# Patient Record
Sex: Female | Born: 1980 | Race: White | Hispanic: No | Marital: Married | Smoking: Never smoker
Health system: Southern US, Community
[De-identification: ages and names within clinical notes are randomized; demographics above are authoritative.]

## PROBLEM LIST (undated history)

## (undated) DIAGNOSIS — T7840XA Allergy, unspecified, initial encounter: Secondary | ICD-10-CM

## (undated) DIAGNOSIS — R011 Cardiac murmur, unspecified: Secondary | ICD-10-CM

## (undated) DIAGNOSIS — R51 Headache: Secondary | ICD-10-CM

## (undated) DIAGNOSIS — R519 Headache, unspecified: Secondary | ICD-10-CM

## (undated) HISTORY — DX: Cardiac murmur, unspecified: R01.1

## (undated) HISTORY — PX: MANDIBLE SURGERY: SHX707

## (undated) HISTORY — DX: Allergy, unspecified, initial encounter: T78.40XA

## (undated) HISTORY — DX: Headache, unspecified: R51.9

## (undated) HISTORY — DX: Headache: R51

---

## 2014-01-03 ENCOUNTER — Encounter: Payer: Self-pay | Admitting: Family Medicine

## 2014-01-03 ENCOUNTER — Ambulatory Visit (INDEPENDENT_AMBULATORY_CARE_PROVIDER_SITE_OTHER): Payer: Federal, State, Local not specified - PPO | Admitting: Family Medicine

## 2014-01-03 VITALS — BP 104/60 | HR 77 | Temp 98.6°F | Ht 64.5 in | Wt 127.4 lb

## 2014-01-03 DIAGNOSIS — Z7689 Persons encountering health services in other specified circumstances: Secondary | ICD-10-CM

## 2014-01-03 DIAGNOSIS — Z8249 Family history of ischemic heart disease and other diseases of the circulatory system: Secondary | ICD-10-CM

## 2014-01-03 DIAGNOSIS — R002 Palpitations: Secondary | ICD-10-CM

## 2014-01-03 DIAGNOSIS — Z7189 Other specified counseling: Secondary | ICD-10-CM

## 2014-01-03 DIAGNOSIS — R011 Cardiac murmur, unspecified: Secondary | ICD-10-CM

## 2014-01-03 DIAGNOSIS — Z0000 Encounter for general adult medical examination without abnormal findings: Secondary | ICD-10-CM

## 2014-01-03 NOTE — Progress Notes (Signed)
No chief complaint on file.   HPI:  Mackenzie DeedsMegan Harrington is here to establish care. Moved from [place], [place].  Last PCP and physical: last physical about 3 years or more  Has the following chronic problems and concerns today:  There are no active problems to display for this patient.  FH HOCM: -mother is going to get surgery for hocm, rest of family saw cards for evaluation -she has hx of ? Murmur -occ palpitations/racing heart at rest -denies: cp, sob, swelling -wants referral to cardiologist as family has been getting on her to do this  Health Maintenance: -needs female physical -unsure of vaccine record but thinks up to date and will check and get back to us  ROS: See pertinent positives and negatives per HPI.  Past Medical History  Diagnosis Date  . Frequent headaches   . Heart murmur   . Allergy     Family History  Problem Relation Age of Onset  . Hypertrophic cardiomyopathy Mother   . Cancer Mother     breast cancer  . Hypertension Father     History   Social History  . Marital Status: Married    Spouse Name: N/A    Number of Children: N/A  . Years of Education: N/A   Social History Main Topics  . Smoking status: Never Smoker   . Smokeless tobacco: None  . Alcohol Use: No  . Drug Use: No  . Sexual Activity: Yes   Other Topics Concern  . None   Social History Narrative   Work or School: homemaker      Home Situation: lives with husband and 3 children 9, 6 and 4 yo (2015)      Spiritual Beliefs: none      Lifestyle: no regular exercise; diet fair             Current outpatient prescriptions:Cetirizine HCl (ZYRTEC PO), Take by mouth., Disp: , Rfl:   EXAM:  Filed Vitals:   01/03/14 1618  BP: 104/60  Pulse: 77  Temp: 98.6 F (37 C)    Body mass index is 21.54 kg/(m^2).  GENERAL: vitals reviewed and listed above, alert, oriented, appears well hydrated and in no acute distress  HEENT: atraumatic, conjunttiva clear, no obvious abnormalities  on inspection of external nose and ears  NECK: no obvious masses on inspection  LUNGS: clear to auscultation bilaterally, no wheezes, rales or rhonchi, good air movement  CV: HRRR, I/VI sem, no peripheral edema  MS: moves all extremities without noticeable abnormality  PSYCH: pleasant and cooperative, no obvious depression or anxiety  ASSESSMENT AND PLAN:  Discussed the following assessment and plan:  Family history of hypertrophic cardiomyopathy - Plan: Ambulatory referral to Cardiology  Palpitations - Plan: Ambulatory referral to Cardiology  Encounter to establish care - Plan: Lipid Panel, Hemoglobin A1c  Heart murmur - Plan: Ambulatory referral to Cardiology  Visit for preventive health examination - Plan: Lipid Panel, Hemoglobin A1c, Basic metabolic panel  -We reviewed the PMH, PSH, FH, SH, Meds and Allergies. -We provided refills for any medications we will prescribe as needed. -We addressed current concerns per orders and patient instructions. -We have asked for records for pertinent exams, studies, vaccines and notes from previous providers. -We have advised patient to follow up per instructions below. -basic labs today -preventive labs today -she will check vaccine record and lt us know if needs any -referred to cardiologist per her request -she wishes to see gyn for female physical and numbers provided to schedule -  she wishes to see derm for skin check and numbers provided to schedule -follow up yearly  -Patient advised to return or notify a doctor immediately if symptoms worsen or persist or new concerns arise.  Patient Instructions  -We have ordered labs or studies at this visit. It can take up to 1-2 weeks for results and processing. We will contact you with instructions IF your results are abnormal. Normal results will be released to your [facility]. If you have not heard from us or can not find your results in [facility] in 2 weeks please contact our  office.  -PLEASE SIGN UP FOR MYCHART TODAY   We recommend the following healthy lifestyle measures: - eat a healthy diet consisting of lots of vegetables, fruits, beans, nuts, seeds, healthy meats such as white chicken and fish and whole grains.  - avoid fried foods, fast food, processed foods, sodas, red meet and other fattening foods.  - get a least 150 minutes of aerobic exercise per week.   Follow up in: 1 year and as needed      Terressa KoyanagiHannah R. Bethany Cumming

## 2014-01-03 NOTE — Progress Notes (Signed)
Pre visit review using our clinic review tool, if applicable. No additional management support is needed unless otherwise documented below in the visit note. 

## 2014-01-03 NOTE — Patient Instructions (Signed)
-  We have ordered labs or studies at this visit. It can take up to 1-2 weeks for results and processing. We will contact you with instructions IF your results are abnormal. Normal results will be released to your MYCHART. If you have not heard from us or can not find your results in MYCHART in 2 weeks please contact our office.  -PLEASE SIGN UP FOR MYCHART TODAY   We recommend the following healthy lifestyle measures: - eat a healthy diet consisting of lots of vegetables, fruits, beans, nuts, seeds, healthy meats such as white chicken and fish and whole grains.  - avoid fried foods, fast food, processed foods, sodas, red meet and other fattening foods.  - get a least 150 minutes of aerobic exercise per week.   Follow up in: 1 year and as needed  

## 2014-01-04 LAB — BASIC METABOLIC PANEL
BUN: 11 mg/dL (ref 6–23)
CHLORIDE: 104 meq/L (ref 96–112)
CO2: 28 meq/L (ref 19–32)
Calcium: 9.4 mg/dL (ref 8.4–10.5)
Creatinine, Ser: 0.5 mg/dL (ref 0.4–1.2)
GFR: 136.58 mL/min (ref >60.00)
Glucose, Bld: 79 mg/dL (ref 70–99)
Potassium: 4.1 mEq/L (ref 3.5–5.1)
Sodium: 138 mEq/L (ref 135–145)

## 2014-01-04 LAB — LIPID PANEL
CHOLESTEROL: 159 mg/dL (ref 0–200)
HDL: 35.4 mg/dL — AB (ref >39.00)
LDL Cholesterol: 94 mg/dL (ref 0–99)
TRIGLYCERIDES: 150 mg/dL — AB (ref 0.0–149.0)
Total CHOL/HDL Ratio: 4
VLDL: 30 mg/dL (ref 0.0–40.0)

## 2014-01-04 LAB — HEMOGLOBIN A1C: HEMOGLOBIN A1C: 4.8 % (ref 4.6–6.5)

## 2014-01-05 ENCOUNTER — Encounter: Payer: Self-pay | Admitting: *Deleted

## 2014-01-23 ENCOUNTER — Encounter: Payer: Self-pay | Admitting: Internal Medicine

## 2014-01-23 ENCOUNTER — Ambulatory Visit (INDEPENDENT_AMBULATORY_CARE_PROVIDER_SITE_OTHER): Payer: Federal, State, Local not specified - PPO | Admitting: Internal Medicine

## 2014-01-23 ENCOUNTER — Encounter: Payer: Self-pay | Admitting: *Deleted

## 2014-01-23 VITALS — BP 110/62 | HR 70 | Ht 64.5 in | Wt 126.0 lb

## 2014-01-23 DIAGNOSIS — R002 Palpitations: Secondary | ICD-10-CM

## 2014-01-23 DIAGNOSIS — Z8249 Family history of ischemic heart disease and other diseases of the circulatory system: Secondary | ICD-10-CM

## 2014-01-23 NOTE — Progress Notes (Signed)
HPI patinet is a 33 yo who is referred for evaluation  Mother has history of HOCM Patient gives history of intermittent palpitations.  Rare.  Usually at rest.  Describes as a fluttering that lasts seconds.  No dizziness  Weird  Infrequent.   She had some dizziness with first pregnancy   None after.No syncope Patient is active with daily activities Denies CP  No SOB  No palpitaitons.    No Known Allergies  Current Outpatient Prescriptions  Medication Sig Dispense Refill  . Cetirizine HCl (ZYRTEC PO) Take by mouth.       No current facility-administered medications for this visit.    Past Medical History  Diagnosis Date  . Frequent headaches   . Heart murmur   . Allergy     Past Surgical History  Procedure Laterality Date  . Mandible surgery      Family History  Problem Relation Age of Onset  . Hypertrophic cardiomyopathy Mother   . Cancer Mother     breast cancer  . Hypertension Father     History   Social History  . Marital Status: Married    Spouse Name: N/A    Number of Children: N/A  . Years of Education: N/A   Occupational History  . Not on file.   Social History Main Topics  . Smoking status: Never Smoker   . Smokeless tobacco: Not on file  . Alcohol Use: No  . Drug Use: No  . Sexual Activity: Yes   Other Topics Concern  . Not on file   Social History Narrative   Work or School: homemaker      Home Situation: lives with husband and 3 children 359, 6 and 4 yo (2015)      Spiritual Beliefs: none      Lifestyle: no regular exercise; diet fair             Review of Systems:  All systems reviewed.  They are negative to the above problem except as previously stated.  Vital Signs: BP 110/62  Pulse 70  Ht 5' 4.5" (1.638 m)  Wt 126 lb (57.153 kg)  BMI 21.30 kg/m2  Physical Exam Patient is in NAD HEENT:  Normocephalic, atraumatic. EOMI, PERRLA.  Neck: JVP is normal.  No bruits.  Lungs: clear to auscultation. No rales no wheezes.  Heart:  Regular rate and rhythm. Normal S1, S2. No S3.   No significant murmurs. PMI not displaced.  Abdomen:  Supple, nontender. Normal bowel sounds. No masses. No hepatomegaly.  Extremities:   Good distal pulses throughout. No lower extremity edema.  Musculoskeletal :moving all extremities.  Neuro:   alert and oriented x3.  CN II-XII grossly intact.  EKG SR 70   Assessment and Plan:  1. HOCM  Will get echo to evaluate.    No murmur on exam  EKG normal I encouraged patient to find out more about mom  [place]  Surgery is planned she says for valve.    2.  Palpitations  Reassured patinet  I am not convinced on any problem  3.  Lipids  LDL is good at 94   HDL a little low  Increase exercise.  F/U based on echo results.

## 2014-01-23 NOTE — Patient Instructions (Signed)
Your physician has requested that you have an echocardiogram. Echocardiography is a painless test that uses sound waves to create images of your heart. It provides your doctor with information about the size and shape of your heart and how well your heart's chambers and valves are working. This procedure takes approximately one hour. There are no restrictions for this procedure.  Your physician recommends that you schedule a follow-up appointment as needed.  

## 2014-02-10 ENCOUNTER — Ambulatory Visit (HOSPITAL_COMMUNITY): Payer: Federal, State, Local not specified - PPO | Attending: Cardiology | Admitting: Cardiology

## 2014-02-10 DIAGNOSIS — I079 Rheumatic tricuspid valve disease, unspecified: Secondary | ICD-10-CM | POA: Insufficient documentation

## 2014-02-10 DIAGNOSIS — Z8249 Family history of ischemic heart disease and other diseases of the circulatory system: Secondary | ICD-10-CM

## 2014-02-10 DIAGNOSIS — R002 Palpitations: Secondary | ICD-10-CM | POA: Insufficient documentation

## 2014-02-10 NOTE — Progress Notes (Signed)
Echo performed. 

## 2014-02-15 ENCOUNTER — Telehealth: Payer: Self-pay | Admitting: *Deleted

## 2014-02-15 NOTE — Telephone Encounter (Signed)
Pt notified about echo results with no evidence of HOCM. Pt then asked if there was any signs of a murmur noted. I advised that Dr. Tenny Craw does not make note of any murmur. Pt asked for results to be mailed to her.

## 2014-02-15 NOTE — Telephone Encounter (Signed)
lmptcb for echo results 

## 2014-07-10 ENCOUNTER — Encounter: Payer: Self-pay | Admitting: Internal Medicine

## 2014-07-13 ENCOUNTER — Ambulatory Visit: Payer: Self-pay | Admitting: Internal Medicine

## 2014-08-15 ENCOUNTER — Encounter: Payer: Self-pay | Admitting: Internal Medicine

## 2014-08-15 ENCOUNTER — Ambulatory Visit (INDEPENDENT_AMBULATORY_CARE_PROVIDER_SITE_OTHER): Payer: Federal, State, Local not specified - PPO | Admitting: Internal Medicine

## 2014-08-15 ENCOUNTER — Other Ambulatory Visit (INDEPENDENT_AMBULATORY_CARE_PROVIDER_SITE_OTHER): Payer: Federal, State, Local not specified - PPO

## 2014-08-15 ENCOUNTER — Ambulatory Visit (INDEPENDENT_AMBULATORY_CARE_PROVIDER_SITE_OTHER): Payer: Federal, State, Local not specified - PPO | Admitting: Geriatric Medicine

## 2014-08-15 VITALS — BP 114/72 | HR 69 | Temp 97.3°F | Resp 16 | Ht 64.0 in | Wt 128.4 lb

## 2014-08-15 DIAGNOSIS — R5383 Other fatigue: Secondary | ICD-10-CM

## 2014-08-15 DIAGNOSIS — Z0000 Encounter for general adult medical examination without abnormal findings: Secondary | ICD-10-CM

## 2014-08-15 DIAGNOSIS — Z23 Encounter for immunization: Secondary | ICD-10-CM

## 2014-08-15 LAB — CBC
HEMATOCRIT: 41.7 % (ref 36.0–46.0)
Hemoglobin: 14.2 g/dL (ref 12.0–15.0)
MCHC: 34.1 g/dL (ref 30.0–36.0)
MCV: 86.1 fl (ref 78.0–100.0)
Platelets: 320 10*3/uL (ref 150.0–400.0)
RBC: 4.84 Mil/uL (ref 3.87–5.11)
RDW: 12.3 % (ref 11.5–15.5)
WBC: 6.3 10*3/uL (ref 4.0–10.5)

## 2014-08-15 LAB — TSH: TSH: 1.2 u[IU]/mL (ref 0.35–4.50)

## 2014-08-15 NOTE — Progress Notes (Signed)
Pre visit review using our clinic review tool, if applicable. No additional management support is needed unless otherwise documented below in the visit note. 

## 2014-08-15 NOTE — Patient Instructions (Signed)
We will check your blood work for the thyroid as well as your blood counts. We will call you back with those results.  We have given you a flu shot today. We will see back next year for your physical. If you have any new problems or questions please feel free to call our office.  We would recommend two groups for gynecology in town:  [facility]: [address] [place] [id number] Phone: [phone]  [facility]: [address], [address] [place] [address] P: [phone]  [facility]: [address],  [place], [place] [address] [phone]

## 2014-08-17 DIAGNOSIS — R5383 Other fatigue: Secondary | ICD-10-CM | POA: Insufficient documentation

## 2014-08-17 DIAGNOSIS — Z0000 Encounter for general adult medical examination without abnormal findings: Secondary | ICD-10-CM | POA: Insufficient documentation

## 2014-08-17 NOTE — Assessment & Plan Note (Signed)
Will check TSH as well as CBC. Possibly the fatigue is due to the weight gain as well as decreased exercise.

## 2014-08-17 NOTE — Assessment & Plan Note (Signed)
Reminded patient about diet and exercise. She will check on when her last tetanus was prior to next visit. She has had flu shot. She is up-to-date on Pap smear.

## 2014-08-17 NOTE — Progress Notes (Signed)
   Subjective:    Patient ID: Mackenzie Harrington, female    DOB: 08-Nov-1980, 33 y.o.   MRN: [id number]  HPI The patient is a 33 year old female who comes in today. She does have past medical history of seasonal allergies. She denies any new complaints today. She does take Zyrtec on most year-round for her allergies. She denies any chest pain, shortness of breath, abdominal pain, joint pain issues. She denies a balance problems or falls. She thinks she's had her tetanus shot within last 10 years and will check her records at home. She has been having a little bit of fatigue and weight gain however she's not clear if the fatigue came because of the weight gain or the weekend started first. She denies any rash, fever, chills.  Review of Systems  Constitutional: Negative for fever, activity change, appetite change, fatigue and unexpected weight change.  HENT: Negative.   Respiratory: Negative for cough, chest tightness, shortness of breath and wheezing.   Cardiovascular: Negative for chest pain, palpitations and leg swelling.  Gastrointestinal: Negative for nausea, abdominal pain, diarrhea, constipation and abdominal distention.  Musculoskeletal: Negative.   Skin: Negative.   Neurological: Negative.       Objective:   Physical Exam  Constitutional: She is oriented to person, place, and time. She appears well-developed and well-nourished.  HENT:  Head: Normocephalic and atraumatic.  Eyes: EOM are normal.  Neck: Normal range of motion.  Cardiovascular: Normal rate and regular rhythm.   Pulmonary/Chest: Effort normal and breath sounds normal. No respiratory distress. She has no wheezes. She has no rales.  Abdominal: Soft. Bowel sounds are normal. She exhibits no distension. There is no tenderness. There is no rebound.  Neurological: She is alert and oriented to person, place, and time. Coordination normal.  Skin: Skin is warm and dry.   Filed Vitals:   08/15/14 1522  BP: 114/72  Pulse: 69  Temp:  97.3 F (36.3 C)  TempSrc: Oral  Resp: 16  Height: 5\' 4"  (1.626 m)  Weight: 128 lb 6.4 oz (58.242 kg)  SpO2: 97%      Assessment & Plan:

## 2015-01-26 ENCOUNTER — Other Ambulatory Visit: Payer: Self-pay | Admitting: Obstetrics and Gynecology

## 2015-01-26 DIAGNOSIS — Z803 Family history of malignant neoplasm of breast: Secondary | ICD-10-CM

## 2015-02-07 ENCOUNTER — Ambulatory Visit
Admission: RE | Admit: 2015-02-07 | Discharge: 2015-02-07 | Disposition: A | Discharge status: Home / Self Care | Payer: Federal, State, Local not specified - PPO | Source: Ambulatory Visit | Attending: Obstetrics and Gynecology | Admitting: Obstetrics and Gynecology

## 2015-02-07 DIAGNOSIS — Z803 Family history of malignant neoplasm of breast: Secondary | ICD-10-CM

## 2015-02-07 MED ORDER — GADOBENATE DIMEGLUMINE 529 MG/ML IV SOLN
12.0000 mL | Freq: Once | INTRAVENOUS | Status: AC | PRN
  Administered 2015-02-07: 12 mL via INTRAVENOUS

## 2015-08-24 ENCOUNTER — Ambulatory Visit (INDEPENDENT_AMBULATORY_CARE_PROVIDER_SITE_OTHER): Payer: Federal, State, Local not specified - PPO

## 2015-08-24 DIAGNOSIS — Z23 Encounter for immunization: Secondary | ICD-10-CM | POA: Diagnosis not present

## 2016-01-21 ENCOUNTER — Ambulatory Visit: Payer: Federal, State, Local not specified - PPO | Admitting: Family

## 2016-03-18 ENCOUNTER — Other Ambulatory Visit: Payer: Self-pay | Admitting: Obstetrics and Gynecology

## 2016-03-18 DIAGNOSIS — Z803 Family history of malignant neoplasm of breast: Secondary | ICD-10-CM

## 2016-04-07 ENCOUNTER — Ambulatory Visit
Admission: RE | Admit: 2016-04-07 | Discharge: 2016-04-07 | Disposition: A | Discharge status: Home / Self Care | Payer: Federal, State, Local not specified - PPO | Source: Ambulatory Visit | Attending: Obstetrics and Gynecology | Admitting: Obstetrics and Gynecology

## 2016-04-07 DIAGNOSIS — Z803 Family history of malignant neoplasm of breast: Secondary | ICD-10-CM

## 2016-04-07 MED ORDER — GADOBENATE DIMEGLUMINE 529 MG/ML IV SOLN
16.0000 mL | Freq: Once | INTRAVENOUS | Status: AC | PRN
  Administered 2016-04-07: 16 mL via INTRAVENOUS

## 2016-04-10 ENCOUNTER — Other Ambulatory Visit: Payer: Self-pay | Admitting: Obstetrics and Gynecology

## 2016-04-10 DIAGNOSIS — N63 Unspecified lump in unspecified breast: Secondary | ICD-10-CM

## 2016-04-11 ENCOUNTER — Ambulatory Visit
Admission: RE | Admit: 2016-04-11 | Discharge: 2016-04-11 | Disposition: A | Discharge status: Home / Self Care | Payer: Federal, State, Local not specified - PPO | Source: Ambulatory Visit | Attending: Obstetrics and Gynecology | Admitting: Obstetrics and Gynecology

## 2016-04-11 ENCOUNTER — Ambulatory Visit: Payer: Federal, State, Local not specified - PPO

## 2016-04-11 ENCOUNTER — Other Ambulatory Visit: Payer: Self-pay | Admitting: Obstetrics and Gynecology

## 2016-04-11 DIAGNOSIS — N63 Unspecified lump in unspecified breast: Secondary | ICD-10-CM

## 2016-04-11 MED ORDER — GADOBENATE DIMEGLUMINE 529 MG/ML IV SOLN
11.0000 mL | Freq: Once | INTRAVENOUS | Status: AC | PRN
  Administered 2016-04-11: 11 mL via INTRAVENOUS

## 2016-11-10 ENCOUNTER — Other Ambulatory Visit: Payer: Self-pay | Admitting: Obstetrics and Gynecology

## 2016-11-10 DIAGNOSIS — N63 Unspecified lump in unspecified breast: Secondary | ICD-10-CM

## 2016-11-20 ENCOUNTER — Ambulatory Visit
Admission: RE | Admit: 2016-11-20 | Discharge: 2016-11-20 | Disposition: A | Discharge status: Home / Self Care | Payer: Federal, State, Local not specified - PPO | Source: Ambulatory Visit | Attending: Obstetrics and Gynecology | Admitting: Obstetrics and Gynecology

## 2016-11-20 DIAGNOSIS — N63 Unspecified lump in unspecified breast: Secondary | ICD-10-CM

## 2016-11-20 MED ORDER — GADOBENATE DIMEGLUMINE 529 MG/ML IV SOLN
12.0000 mL | Freq: Once | INTRAVENOUS | Status: AC | PRN
  Administered 2016-11-20: 12 mL via INTRAVENOUS

## 2017-04-30 ENCOUNTER — Encounter: Payer: Federal, State, Local not specified - PPO | Admitting: Genetics

## 2017-04-30 ENCOUNTER — Other Ambulatory Visit: Payer: Federal, State, Local not specified - PPO

## 2017-05-05 ENCOUNTER — Ambulatory Visit (HOSPITAL_BASED_OUTPATIENT_CLINIC_OR_DEPARTMENT_OTHER): Payer: Federal, State, Local not specified - PPO | Admitting: Genetics

## 2017-05-05 ENCOUNTER — Encounter: Payer: Self-pay | Admitting: Genetics

## 2017-05-05 ENCOUNTER — Other Ambulatory Visit: Payer: Federal, State, Local not specified - PPO

## 2017-05-05 DIAGNOSIS — Z7183 Encounter for nonprocreative genetic counseling: Secondary | ICD-10-CM

## 2017-05-05 DIAGNOSIS — Z803 Family history of malignant neoplasm of breast: Secondary | ICD-10-CM | POA: Diagnosis not present

## 2017-05-05 NOTE — Progress Notes (Signed)
REFERRING PROVIDER: Marylynn Pearson, [place], [address] [place], [place] [address]  PRIMARY PROVIDER:  Aura Dials, PA-C  PRIMARY REASON FOR VISIT:  1. Family history of breast cancer     HISTORY OF PRESENT ILLNESS:   Ms. Steidle, a 36 y.o. female, was seen for a [place] cancer genetics consultation at the request of Dr. Julien Girt due to a family history of cancer.  Ms. Kloos presents to clinic today to discuss the possibility of a hereditary predisposition to cancer, genetic testing, and to further clarify her future cancer risks, as well as potential cancer risks for family members.     CANCER HISTORY: Ms. Lewison is a 36 y.o. female with no personal history of cancer.    HORMONAL RISK FACTORS:  Menarche was at age 20.  First live birth at age 80.  OCP use for approximately 12 years, used between ages [age] though stopped intermittently for pregnancies. Ovaries intact: yes.  Hysterectomy: no.  Menopausal status: premenopausal.  HRT use: 0 years. Colonoscopy: no; not examined. Mammogram within the last year: yes. Number of breast biopsies: 0. Up to date with pelvic exams:  yes. Any excessive radiation exposure in the past:  no  Past Medical History:  Diagnosis Date  . Allergy   . Frequent headaches   . Heart murmur     Past Surgical History:  Procedure Laterality Date  . MANDIBLE SURGERY      Social History   Social History  . Marital status: Married    Spouse name: N/A  . Number of children: N/A  . Years of education: N/A   Social History Main Topics  . Smoking status: Never Smoker  . Smokeless tobacco: Not on file  . Alcohol use No  . Drug use: No  . Sexual activity: Yes   Other Topics Concern  . Not on file   Social History Narrative   Work or School: homemaker      Home Situation: lives with husband and 3 children 2, 20 and 98 yo (2015)      Spiritual Beliefs: none      Lifestyle: no regular exercise; diet fair               FAMILY HISTORY:  We obtained a detailed, 4-generation family history.  Significant diagnoses are listed below: Family History  Problem Relation Age of Onset  . Hypertrophic cardiomyopathy Mother   . Breast cancer Mother 38       treated with mastectomy  . Hypertension Father   Ms. Cartelli has two sons and a daughter (ages 73, 38, and 31) who are all healthy. Ms. Mudry has three sisters (ages 8, 94, and 53) and one brother (age 60) who are all without cancers.  Ms. Plitt mother was diagnosed with breast cancer at age 68, was treated with unilateral mastectomy, and is doing well at age 65 without additional cancers. Ms. Eshleman had two maternal aunts. One died "young" and another died at 34, both due to accidents. Ms. Botelho maternal uncle is 23 without cancers. Ms. Oddo maternal grandfather died at 8 from a heart attack. Her maternal grandmother died at 82 without cancers.  Ms. Oconnor father is 21 without cancers. She has two paternal uncles (ages 67 and 100) who are also without cancers. Both of Ms. Cohea's paternal grandparents died in their 26s without cancers.  Ms. Forgy is unaware of previous family history of genetic testing for hereditary cancer risks. Patient's maternal and paternal ancestors  are of Caucasian descent. There is no reported Ashkenazi Jewish ancestry. There is no known consanguinity.  GENETIC COUNSELING ASSESSMENT: Akeelah Seppala is a 36 y.o. female with a family history of breast cancer diagnosed at age 53 in her mother, which is somewhat suggestive of a hereditary cancer syndrome and predisposition to cancer. We, therefore, discussed and recommended the following at today's visit.   DISCUSSION: We reviewed the characteristics, features and inheritance patterns of hereditary cancer syndromes. We also discussed genetic testing, including the appropriate family members to test, the process of testing, insurance coverage and turn-around-time for results. We discussed the  implications of a negative, positive and/or variant of uncertain significant result. We recommended Ms. Rider pursue genetic testing for the Common Hereditary Cancer Panel offered by [facility]. [facility]'s Common Hereditary Cancers Panel includes analysis of the following 46 genes: APC, ATM, AXIN2, BARD1, BMPR1A, BRCA1, BRCA2, BRIP1, CDH1, CDKN2A, CHEK2, CTNNA1, DICER1, EPCAM, GREM1, HOXB13, KIT, MEN1, MLH1, MSH2, MSH3, MSH6, MUTYH, NBN, NF1, NTHL1, PALB2, PDGFRA, PMS2, POLD1, POLE, PTEN, RAD50, RAD51C, RAD51D, SDHA, SDHB, SDHC, SDHD, SMAD4, SMARCA4, STK11, TP53, TSC1, TSC2, and VHL.  Based on Ms. Puleo's family history of cancer, she meets medical criteria for genetic testing. Despite that she meets criteria, she may still have an out of pocket cost. We discussed that if her out of pocket cost for testing is over $100, the laboratory will call and confirm whether she wants to proceed with testing.  If the out of pocket cost of testing is less than $100 she will be billed by the genetic testing laboratory.   PLAN: After considering the risks, benefits, and limitations, Ms. Geter  provided informed consent to pursue genetic testing and the blood sample was sent to [facility] for analysis of the 46-gene Common Hereditary Cancer Panel. Results should be available within approximately 3 weeks' time, at which point they will be disclosed by telephone to Ms. Mcgough, as will any additional recommendations warranted by these results. This information will also be available in Epic.   Lastly, we encouraged Ms. Karlen to remain in contact with cancer genetics annually so that we can continuously update the family history and inform her of any changes in cancer genetics and testing that may be of benefit for this family.   Ms.  Branscomb questions were answered to her satisfaction today. Our contact information was provided should additional questions or concerns arise. Thank you for the referral and allowing [place] to  share in the care of your patient.   Mal Misty, MS, [facility] Certified Naval [email or web address] phone: [phone]  The patient was seen for a total of 40 minutes in face-to-face genetic counseling.   _______________________________________________________________________ For Office Staff:  Number of people involved in session: 1 Was an Intern/ student involved with case: yes   [place] (IBIS) Breast Cancer Risk Model Summary

## 2017-05-19 ENCOUNTER — Telehealth: Payer: Self-pay | Admitting: Genetics

## 2017-05-20 NOTE — Telephone Encounter (Signed)
Patient called back to discuss genetic test results.  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  I discussed that while reassuring, she should still continue to follow breast screening recommendations by her doctors.  Discussed that genetic testing is not perfect and there could still be a genetic mutation in a gene that was not detected by current technology or in a gene that was not tested or not yet identified to increase cancer risk.  Will mail results and provided my contact information for any further questions.

## 2017-05-25 ENCOUNTER — Ambulatory Visit: Payer: Self-pay | Admitting: Genetics

## 2017-05-25 ENCOUNTER — Encounter: Payer: Self-pay | Admitting: Genetics

## 2017-05-25 DIAGNOSIS — Z1379 Encounter for other screening for genetic and chromosomal anomalies: Secondary | ICD-10-CM

## 2017-05-25 DIAGNOSIS — Z803 Family history of malignant neoplasm of breast: Secondary | ICD-10-CM

## 2017-05-25 NOTE — Progress Notes (Signed)
HPI: Mackenzie [facility]rington was previously seen in the [facility] on 05/05/2017 due to a family history of breast cancer and concerns regarding a hereditary predisposition to cancer. Please refer to our prior cancer genetics clinic note for more information regarding Mackenzie Harrington's medical, social and family histories, and our assessment and recommendations, at the time. [facility] Harrington recent genetic test results were disclosed to her, as well as recommendations warranted by these results. These results and recommendations are discussed in more detail below.   FAMILY HISTORY:  We obtained a detailed, 4-generation family history.  Significant diagnoses are listed below: Family History  Problem Relation Age of Onset  . Hypertrophic cardiomyopathy Mother   . Breast cancer Mother 39       treated with mastectomy  . Hypertension Father    Mackenzie Harrington has two sons and a daughter (ages 54, 63, and 87) who are all healthy. Mackenzie Harrington has three sisters (ages 41, 59, and 108) and one brother (age 68) who are all without cancers.  Mackenzie Harrington mother was diagnosed with breast cancer at age 57, was treated with unilateral mastectomy, and is doing well at age 72 without additional cancers. Mackenzie Harrington had two maternal aunts. One died "young" and another died at 59, both due to accidents. Mackenzie Harrington maternal uncle is 50 without cancers. Mackenzie Harrington maternal grandfather died at 80 from a heart attack. Her maternal grandmother died at 27 without cancers.  Mackenzie Harrington father is 45 without cancers. She has two paternal uncles (ages 62 and 50) who are also without cancers. Both of Mackenzie Harrington's paternal grandparents died in their 65s without cancers.  Mackenzie Harrington is unaware of previous family history of genetic testing for hereditary cancer risks. Patient's maternal and paternal ancestors are of Caucasian descent. There is no reported Ashkenazi Jewish ancestry. There is no known consanguinity.  GENETIC TEST RESULTS:  Genetic testing performed through [facility]'s Common Hereditary Cancer Panel reported out on 05/15/2017 showed no pathogenic mutations. The Hereditary Gene Panel offered by [facility] includes sequencing and/or deletion duplication testing of the following 46 genes: APC, ATM, AXIN2, BARD1, BMPR1A, BRCA1, BRCA2, BRIP1, CDH1, CDKN2A (p14ARF), CDKN2A (p16INK4a), CHEK2, CTNNA1, DICER1, EPCAM (Deletion/duplication testing only), GREM1 (promoter region deletion/duplication testing only), KIT, MEN1, MLH1, MSH2, MSH3, MSH6, MUTYH, NBN, NF1, NHTL1, PALB2, PDGFRA, PMS2, POLD1, POLE, PTEN, RAD50, RAD51C, RAD51D, SDHB, SDHC, SDHD, SMAD4, SMARCA4. STK11, TP53, TSC1, TSC2, and VHL.  The following genes were evaluated for sequence changes only: SDHA and HOXB13 c.251G>A variant only..  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   The test report will be scanned into [facility] and will be located under the Molecular Pathology section of the Results Review tab.A portion of the result report is included below for reference.      We discussed with Mackenzie Harrington that because current genetic testing is not perfect, it is possible there may be a gene mutation in one of these genes that current testing cannot detect, but that chance is small. We also discussed, that there could be another gene that has not yet been discovered, or that we have not yet tested, that is responsible for the cancer diagnoses in the family. It is also possible that there is a hereditary cause for Mackenzie Harrington's family history of cancer that she did not inherit and therefore was not detected in her.  Therefore, it is important to remain in touch with cancer genetics in  the future so that we can continue to offer Mackenzie Harrington the most up to date genetic testing.   Regarding the VUS in PALB2: At this time, it is unknown if this variant is associated with increased cancer risk or if this is a normal finding, but  most variants such as this get reclassified to being inconsequential. It should not be used to make medical management decisions. With time, we suspect the lab will determine the significance of this variant, if any. If we do learn more about it, we will try to contact [facility] to discuss it further. However, it is important to stay in touch with [place] periodically and keep the address and phone number up to date.  ADDITIONAL GENETIC TESTING: We discussed with [facility]enzie Harrington that there are other genes that are associated with increased cancer risk that can be analyzed. The laboratories that offer this testing look at these additional genes via a hereditary cancer gene panel. Should Mackenzie Harrington wish to pursue additional genetic testing, we are happy to discuss and coordinate this testing, at any time.    CANCER SCREENING RECOMMENDATIONS: This normal result indicates that it is unlikely Mackenzie Harrington has an increased risk of cancer due to a mutation in one of these genes.  Therefore, Mackenzie Harrington was advised to continue following the cancer screening guidelines provided by her primary healthcare providers. Other factors such as her personal and family history may still affect her cancer risk.    Based on the Mackenzie Harrington's personal and family history of cancer, as well as her genetic test results, the statistical model [facility] was used to estimate her risk of developing breast cancer. This estimates her lifetime risk of developing breast caner to be approximately 18.6% The patient's lifetime breast cancer risk is a preliminary estimate based on available information using one of several models endorsed by the [facility] (ACS). The ACS recommends consideration of breast MRI screening as an adjunct to mammography for patients at high risk (defined as 20% or greater lifetime risk). A more detailed breast cancer risk assessment can be considered, if clinically indicated.     RECOMMENDATIONS FOR FAMILY MEMBERS:  Women in this family might be at some increased risk of developing cancer, over the general population risk, simply due to the family history of cancer. We recommended women in this family have a yearly mammogram beginning at age 31, or 58 years younger than the earliest onset of cancer, an annual clinical breast exam, and perform monthly breast self-exams.  We would recommend that Ms. Shelburne and her sisters start mammograms at 76 (or now if she has had not yet started).  Women in this family should also have a gynecological exam as recommended by their primary provider. All family members should have a colonoscopy by age 48.  Based on Ms. Catano's family history, we recommended her mother, who was diagnosed with breast at age 60, have genetic counseling and testing.  If her mother declines, we would recommend that Ms. Plotts's siblings and maternal relatives have genetic counseling and genetic testing.  Ms. Ziehm will let us know if we can be of any assistance in coordinating genetic counseling and/or testing for these family members.   FOLLOW-UP: Lastly, we discussed with Ms. Butz that cancer genetics is a rapidly advancing field and it is possible that new genetic tests will be appropriate for her and/or her family members in the future. We encouraged her to remain in contact with cancer genetics on  an annual basis so we can update her personal and family histories and let her know of advances in cancer genetics that may benefit this family.   Our contact number was provided. Ms. Celaya questions were answered to her satisfaction, and she knows she is welcome to call us at anytime with additional questions or concerns.   Ferol Luz, MS Genetic Counselor [email or web address]

## 2017-10-26 ENCOUNTER — Telehealth: Payer: Self-pay | Admitting: Genetics

## 2017-10-26 NOTE — Telephone Encounter (Signed)
Returned patient's call to our Environmental health practitioneradministrative assistant regarding billing.  I gave her my contact information, but directed her to calling the laboratory as they will have the most information about her insurance claim.  I left their phone number for her.

## 2018-03-16 ENCOUNTER — Telehealth: Payer: Self-pay | Admitting: Genetic Counselor

## 2018-03-16 NOTE — Telephone Encounter (Signed)
LM on VM that we have an update on her results from last year and to please give me a call.

## 2018-03-22 NOTE — Telephone Encounter (Signed)
LM on VM that we have updated information on her testing.  Please call back.  Left CB instructions.

## 2018-03-23 NOTE — Telephone Encounter (Signed)
Left message on VM asking for her to CB about amended report.  I will reach out to Dr. Garlan FillersAdkin's office to confirm that we have the correct phone number.

## 2018-03-24 ENCOUNTER — Telehealth: Payer: Self-pay | Admitting: Genetic Counselor

## 2018-03-24 NOTE — Telephone Encounter (Signed)
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  Patient will be out of town until next week and will come in for [facility] on 7/29.

## 2018-04-05 ENCOUNTER — Inpatient Hospital Stay: Payer: Federal, State, Local not specified - PPO

## 2018-04-05 ENCOUNTER — Encounter: Payer: Self-pay | Admitting: Genetic Counselor

## 2018-05-05 ENCOUNTER — Inpatient Hospital Stay: Payer: Federal, State, Local not specified - PPO | Attending: Genetic Counselor | Admitting: Genetic Counselor

## 2018-05-05 DIAGNOSIS — Z7183 Encounter for nonprocreative genetic counseling: Secondary | ICD-10-CM

## 2018-05-05 DIAGNOSIS — Z1589 Genetic susceptibility to other disease: Secondary | ICD-10-CM

## 2018-05-05 DIAGNOSIS — Z803 Family history of malignant neoplasm of breast: Secondary | ICD-10-CM | POA: Diagnosis not present

## 2018-05-05 DIAGNOSIS — Z1501 Genetic susceptibility to malignant neoplasm of breast: Secondary | ICD-10-CM

## 2018-05-05 DIAGNOSIS — Z1509 Genetic susceptibility to other malignant neoplasm: Secondary | ICD-10-CM

## 2018-05-05 DIAGNOSIS — Z1379 Encounter for other screening for genetic and chromosomal anomalies: Secondary | ICD-10-CM | POA: Diagnosis not present

## 2018-05-05 NOTE — Progress Notes (Signed)
GENETIC TEST RESULTS   Patient Name: Mackenzie Harrington Patient Age: 37 y.o. 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 Please refer to the prior Genetics clinic note for more information regarding Mackenzie Harrington's medical and family histories and our assessment at the time.   FAMILY HISTORY:  We obtained a detailed, 4-generation family history.  Significant diagnoses are listed below: Family History  Problem Relation Age of Onset  . Hypertrophic cardiomyopathy Mother   . Breast cancer Mother 41       treated with mastectomy  . Hypertension Father     Mackenzie Harrington has two sons and a daughter (ages 83, 86, and 24) who are all healthy. Mackenzie Harrington has three sisters (ages 33, 22, and 51) and one brother (age 94) who are all without cancers.  Mackenzie Harrington mother was diagnosed with breast cancer at age 55, was treated with unilateral mastectomy, and is doing well at age 31 without additional cancers. Mackenzie Harrington had two maternal aunts. One died "young" and another died at 53, both due to accidents. Mackenzie Harrington maternal uncle is 45 without cancers. Mackenzie Harrington maternal grandfather died at 63 from a heart attack. Her maternal grandmother died at 29 without cancers.  Mackenzie Harrington father is 31 without cancers. She has two paternal uncles (ages 61 and 32) who are also without cancers. Both of Mackenzie Harrington's paternal grandparents died in their 59's without cancers.  Mackenzie Harrington unawareof previous family history of genetic testing for hereditary cancer risks. Patient's maternaland paternal ancestors are of Caucasiandescent. There is noreported Ashkenazi Jewish ancestry. There is noknown  consanguinity.  GENETIC TESTING: Mackenzie Harrington underwent genetic testing on May 05, 2018.  Her testing at the time identified a Variant of Uncertain Significance in PALB2.  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 There were no deleterious mutations in APC, ATM, AXIN2, BARD1, BMPR1A, BRCA1, BRCA2, BRIP1, CDH1, CDK4, CDKN2A, CHEK2, CTNNA1, DICER1, EPCAM, GREM1, HOXB13, KIT, MEN1, MLH1, MSH2, MSH3, MSH6, MUTYH, NBN, NF1, NTHL1, PDGFRA, PMS2, POLD1, POLE, PTEN, RAD50, RAD51C, RAD51D, SDHA, SDHB, SDHC, SDHD, SMAD4, SMARCA4. STK11, TP53, TSC1, TSC2, and VHL .  Clinical condition The risk of breast cancer in women with a single pathogenic PALB2 variant is 33-58% by age 24, with higher risks among those with a greater number of relatives with breast cancer (PMID: 16109604, 54098119, 14782956, 21308657). One study found the risk of developing contralateral breast cancer is approximately 10% within five years after the initial diagnosis of breast cancer among individuals with a pathogenic variant in [facility] (PMID: 84696295).  For both men and women, there is also an increased risk for pancreatic cancer, however, specific risk figures are not yet established (PMID: 28413244, 01027253, 66440347). Additional data suggests an increased risk of ovarian cancer (PMID: 42595638, 75643329) and female breast cancer (PMID: 51884166, 06301601, 09323557), although this evidence is limited and emerging.  Gene information PALB2 is a tumor-suppressor gene, meaning its function is to help control the rate of growth and cell division in the body. The protein product plays a critical role in homologous recombination repair (  HRR) through its ability to recruit BRCA2 and RAD51 to DNA breaks (Uniprot: [email or web address]_HUMAN,[id number] [email or web address].tn. Accessed  January 2017). If there is a pathogenic variant in this gene that prevents it from functioning normally, the risk of developing certain types of cancers may be increased.  This sequence change fall in intron 10 of the PALB2 gene.  While it does not directly change the encoded amino acid sequence, studies indicate (PMID: 19509326, 71245809) that it does affect the splice site of the intron.  Nucleotide substitutions within the consensus splice site are relatively common cause of aberrant splicing.  Algorithms developed to predict the effect of sequence changes on RNA splicing suggest that this variant may disrupt the consensus splice site, but this prediction has not been confirmed by published transcriptional studies.  Other variants that disrupt this nucleotide have been determined to be pathogenic.  This suggests that this nucleotide is clinically-significant and that variants that disrupt this position are likely to be disease causing.  Inheritance Hereditary predisposition to cancer due to pathogenic variants in the PALB2 gene has autosomal dominant inheritance. This means that an individual with a pathogenic variant has a 50% chance of passing the condition on to their offspring. Once a pathogenic mutation is detected in an individual, it is possible to identify at-risk relatives who can pursue testing for this specific familial variant. Many cases are inherited from a parent, but some cases may occur spontaneously (i.e., an individual with a pathogenic variant who has parents who do not have it).  Individuals with a single pathogenic PALB2 variant are also carriers of autosomal recessive Fanconi anemia type N. Fanconi anemia is characterized by bone marrow failure with variable additional anomalies, which often include short stature, abnormal skin pigmentation, abnormal thumbs, malformations of the skeletal and central nervous systems, and developmental delay (PMID: 9833825, 05397673). Risk of leukemia  and early onset solid tumors is significantly elevated with this disorder (PMID: 41937902, 40973532, 99242683). For there to be a risk of Fanconi anemia in offspring, both the patient and their partner would each have to carry a pathogenic variant in [place]; in this case, the risk to have an affected child is 25%.  MEDICAL MANAGEMENT: The [place] (NCCN) has published screening and surveillance guidelines for women with a single pathogenic variant in [place] (NCCN. Genetic/Familial High-Risk Assessment: Breast and Ovarian. Version 1.2019):  - Annual mammography with consideration of tomosynthesis beginning at age 35 . Consider annual breast MRI with contrast starting at age 83, with modification as appropriate based on family history . Prophylactic risk-reducing mastectomy: consider based on family history (evidence insufficient; manage based on family history)  - NCCN cites insufficient evidence to warrant screening for ovarian and pancreatic cancer (NCCN. Genetic/Familial High-Risk Assessment: Breast and Ovarian. Version 3.2019). In contrast, the [employer] of Gastroenterology Clinical Guidelines recommend pancreatic cancer screening in PALB2 carriers be limited to those with a first- or second-degree relative affected with pancreatic cancer. Ideally, screening should be performed in experienced centers utilizing a multidisciplinary approach under research conditions. Recommended screening includes annual endoscopic ultrasound and/or MRI of the pancreas starting at age 58 or 31 years younger than the earliest age of pancreatic cancer diagnosis in the family (PMID: 41962229).  Overall cancer risk assessment incorporates additional factors including personal medical history, family history, and any available genetic information that may result in a personalized plan for cancer prevention and surveillance.  FAMILY MEMBERS: It is important that all of Ms. Canal's relatives  (both men and women) know  of the presence of this gene mutation. Site-specific genetic testing can sort out who in the family is at risk and who is not. Knowing if a PALB2 pathogenic variant is present is advantageous. At-risk relatives can be identified, enabling pursuit of a diagnostic evaluation. Further, the available information regarding hereditary cancer susceptibility genes is constantly evolving and more clinically relevant data regarding PALB2 are likely to become available in the near future. Awareness of this cancer predisposition encourages patients and their providers to inform at-risk family members, to diligently follow recommended screening protocols, and to be vigilant in maintaining close and regular contact with their local genetics clinic in anticipation of new information.  Ms. Berwanger children are have a 50% chance to have inherited this mutation. However, they are relatively young and this will not be of any consequence to them for several years. We do not test children because there is no risk to them until they are adults. We recommend they have genetic counseling and testing by the time they are in their early 20's.    Ms. Shankland siblings and parents have a 50% chance to have inherited this mutation. We recommend they have genetic testing for this same mutation, as identifying the presence of this mutation would allow them to also take advantage of risk-reducing measures.   [facility] has a testing policy that they will cover single site genetic testing for any family member within 72 days of Ms. Nield's report date.  Therefore, her family members have until the end of September to undergo genetic testing and have it covered by this policy.  She was given information on genetic services in areas around the country for her siblings.  SUPPORT AND RESOURCES: If Ms. Eugene is interested in PALB2-information and support, there are two groups, Facing Our Risk (www.facingourrisk.com) and  Bright Pink (www.brightpink.org) which some people have found useful. They provide opportunities to speak with other individuals from high-risk families. To locate genetic counselors in other cities, visit the website of the [employer] (ArtistMovie.se) and Secretary/administrator for a Social worker by zip code.  We encouraged Ms. Dumas to remain in contact with [place] on an annual basis so we can update her personal and family histories, and let her know of advances in cancer genetics that may benefit the family. Our contact number was provided. Ms. Burling questions were answered to her satisfaction today, and she knows she is welcome to call anytime with additional questions.   Clevon Khader P. Florene Glen, [place], [facility] Certified Genetic Counselor [email or web address] phone: [phone]

## 2018-05-18 ENCOUNTER — Other Ambulatory Visit: Payer: Self-pay | Admitting: Obstetrics and Gynecology

## 2018-05-18 DIAGNOSIS — R928 Other abnormal and inconclusive findings on diagnostic imaging of breast: Secondary | ICD-10-CM

## 2018-05-24 ENCOUNTER — Other Ambulatory Visit: Payer: Self-pay | Admitting: Obstetrics and Gynecology

## 2018-05-24 ENCOUNTER — Ambulatory Visit
Admission: RE | Admit: 2018-05-24 | Discharge: 2018-05-24 | Disposition: A | Discharge status: Home / Self Care | Payer: Federal, State, Local not specified - PPO | Source: Ambulatory Visit | Attending: Obstetrics and Gynecology | Admitting: Obstetrics and Gynecology

## 2018-05-24 DIAGNOSIS — N6310 Unspecified lump in the right breast, unspecified quadrant: Secondary | ICD-10-CM

## 2018-05-24 DIAGNOSIS — R928 Other abnormal and inconclusive findings on diagnostic imaging of breast: Secondary | ICD-10-CM

## 2018-05-27 ENCOUNTER — Other Ambulatory Visit: Payer: Self-pay | Admitting: Obstetrics and Gynecology

## 2018-05-27 DIAGNOSIS — Z803 Family history of malignant neoplasm of breast: Secondary | ICD-10-CM

## 2018-07-26 ENCOUNTER — Ambulatory Visit
Admission: RE | Admit: 2018-07-26 | Discharge: 2018-07-26 | Disposition: A | Discharge status: Home / Self Care | Payer: Federal, State, Local not specified - PPO | Source: Ambulatory Visit | Attending: Obstetrics and Gynecology | Admitting: Obstetrics and Gynecology

## 2018-07-26 DIAGNOSIS — Z803 Family history of malignant neoplasm of breast: Secondary | ICD-10-CM

## 2018-07-26 MED ORDER — GADOBUTROL 1 MMOL/ML IV SOLN
5.0000 mL | Freq: Once | INTRAVENOUS | Status: AC | PRN
  Administered 2018-07-26: 5 mL via INTRAVENOUS

## 2018-11-24 ENCOUNTER — Other Ambulatory Visit: Payer: Self-pay

## 2018-11-24 ENCOUNTER — Ambulatory Visit
Admission: RE | Admit: 2018-11-24 | Discharge: 2018-11-24 | Disposition: A | Discharge status: Home / Self Care | Payer: Federal, State, Local not specified - PPO | Source: Ambulatory Visit | Attending: Obstetrics and Gynecology | Admitting: Obstetrics and Gynecology

## 2018-11-24 ENCOUNTER — Ambulatory Visit: Payer: Federal, State, Local not specified - PPO

## 2018-11-24 DIAGNOSIS — N6310 Unspecified lump in the right breast, unspecified quadrant: Secondary | ICD-10-CM

## 2018-12-22 IMAGING — MR MR BILATERAL BREAST WITHOUT AND WITH CONTRAST
7 of 12 series · 29 of 48 positions shown · IV contrast (5 ml eovist)
Comparison: BILATERAL Breast MRI 11/20/2016.

CLINICAL DATA: 37-year-old for intermediate to high risk
supplemental screening MRI. The patient has a AXT22 gene mutation
which is of uncertain significance on prior genetic testing. Family
history of breast cancer in her mother at age 43. Intermediate
lifetime risk of breast cancer of approximately 18.6% according to
the Tyrer Cuzick model. Patient has heterogeneously dense breasts on
mammography.

LABS:  Not applicable.
EXAM:
BILATERAL BREAST MRI WITH AND WITHOUT CONTRAST
TECHNIQUE: Multiplanar, multisequence MR images of both breasts were obtained
prior to and following the intravenous administration of 5 ml of
Gadavist.
Three-dimensional MR images were rendered by post-processing of the
original MR data on the PACS workstation using [employer] DynaCAD. The
three-dimensional MR images were interpreted, and findings are
reported in the following complete MRI report for this study.

[Series 2: t2_tirm_tra ipat (a-p) · axial · 3.0mm · 0.70mm/px · 1 of 60 slices shown]
[im 1/60]
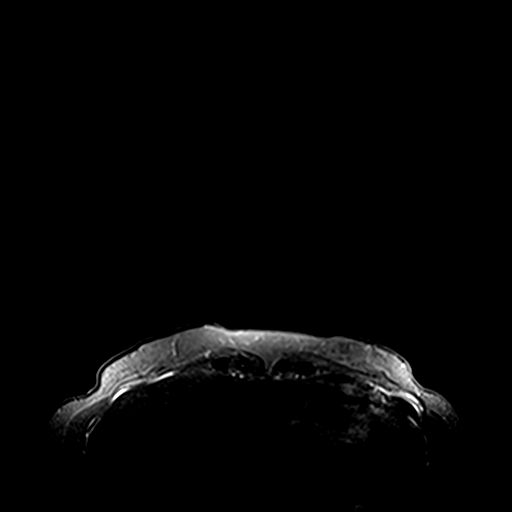

[Series 3: fl3d pre-cm no · axial · non-contrast · 0.9mm · 0.94mm/px · z∈[-74,+83]mm · 5 of 176 slices shown]
[im 1/176]
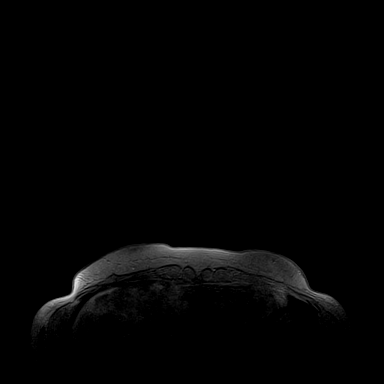
[im 44/176]
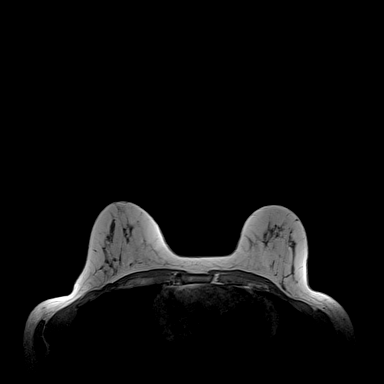
[im 88/176]
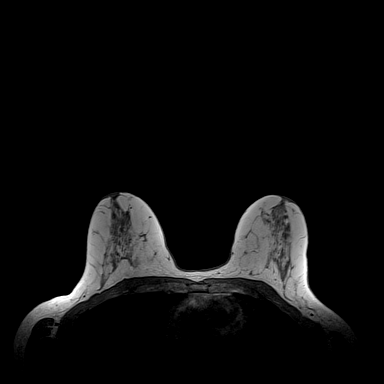
[im 132/176]
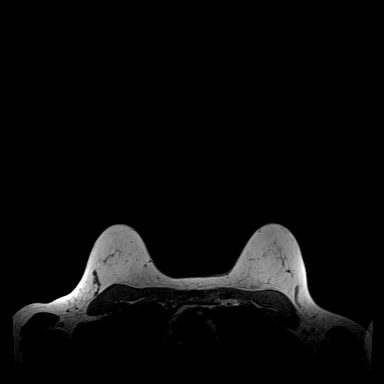
[im 176/176]
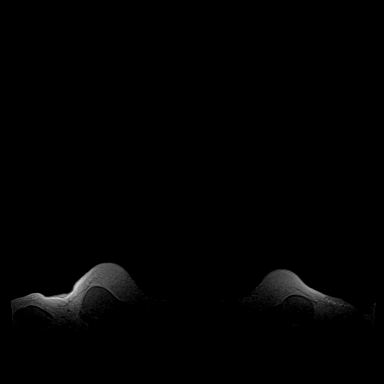

[Series 4: fl3d pre-cm · axial · non-contrast · 0.9mm · 0.87mm/px · z∈[-74,+83]mm · 5 of 176 slices shown]
[im 1/176]
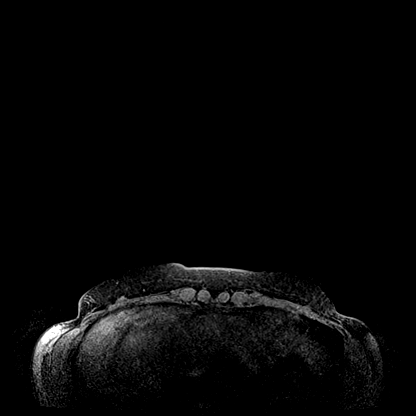
[im 44/176]
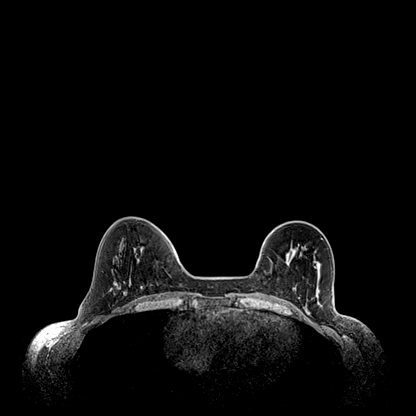
[im 88/176]
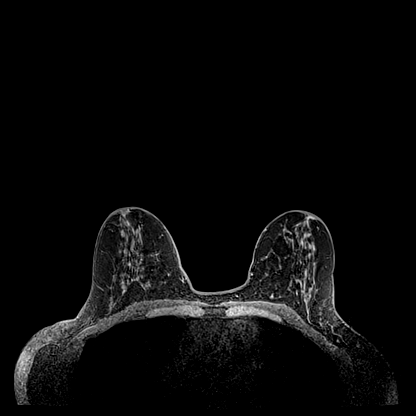
[im 132/176]
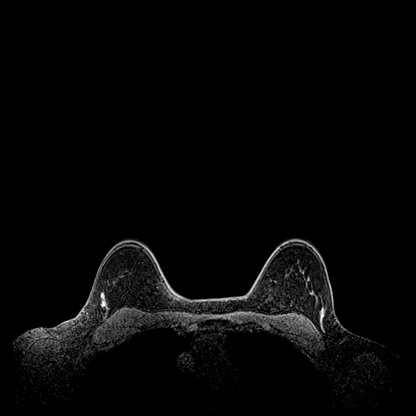
[im 176/176]
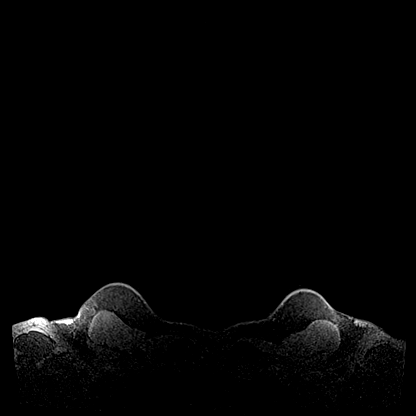

[Series 5: fl3d post-cm 20 · axial · 0.9mm · 0.87mm/px · z∈[-74,+83]mm · 6 of 176 slices shown (1 of 3)]
[im 1/176]
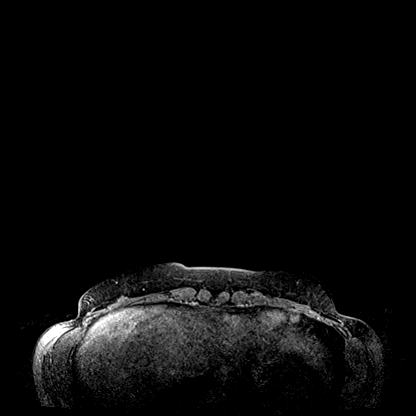
[im 36/176]
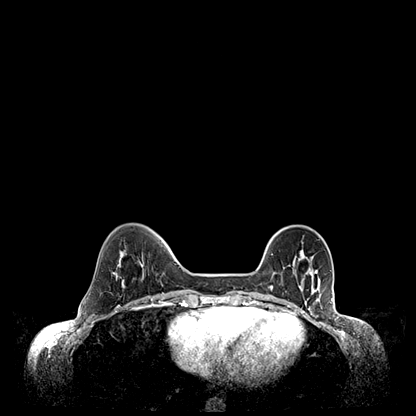
[im 71/176]
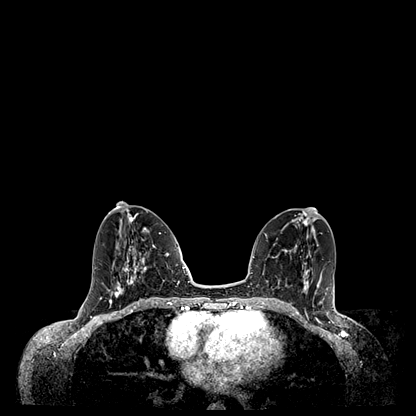
[im 106/176]
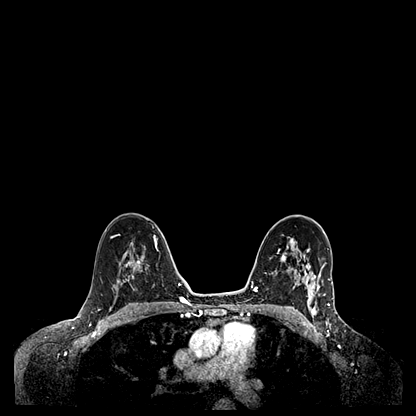
[im 141/176]
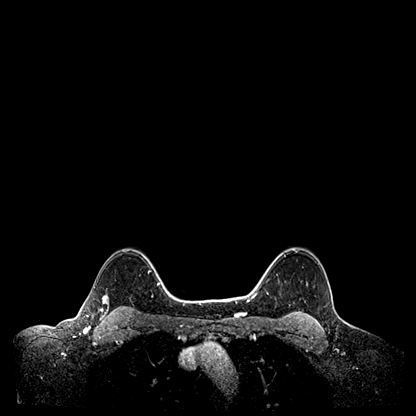
[im 176/176]
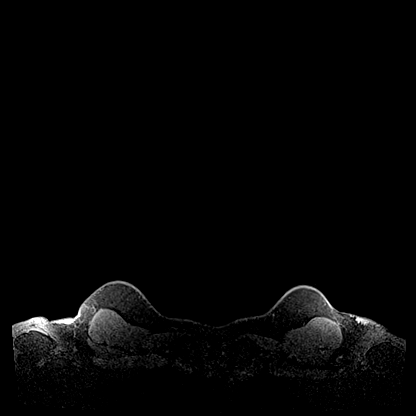

[Series 6: fl3d post-cm 20 · axial · 0.9mm · 0.87mm/px · z∈[-74,+83]mm · 6 of 165 slices shown (2 of 3)]
[im 1/165]
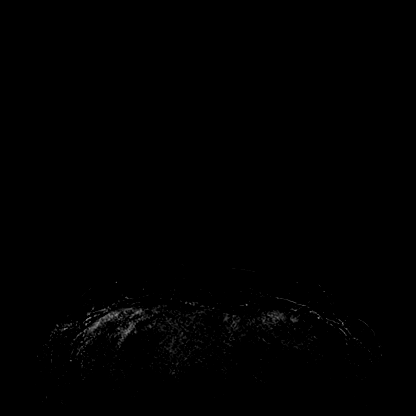
[im 33/165]
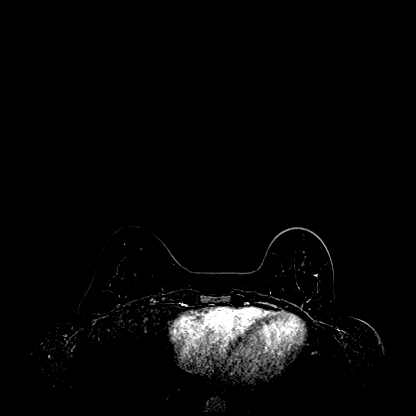
[im 66/165]
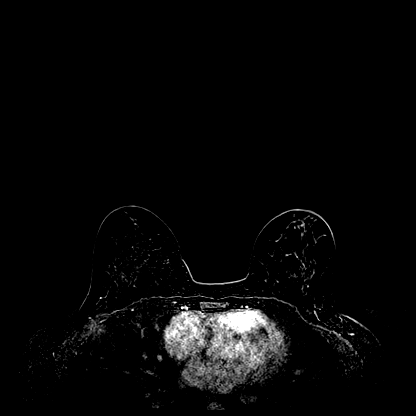
[im 99/165]
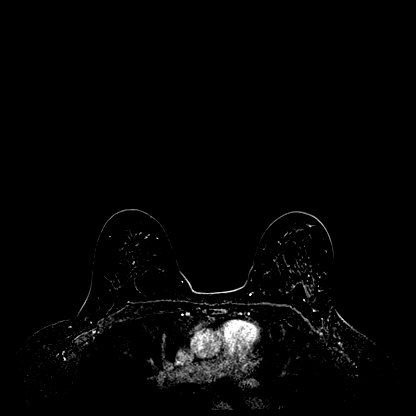
[im 132/165]
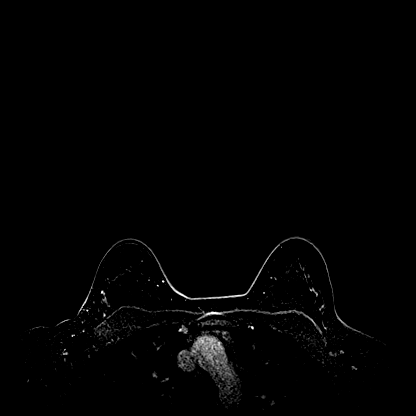
[im 165/165]
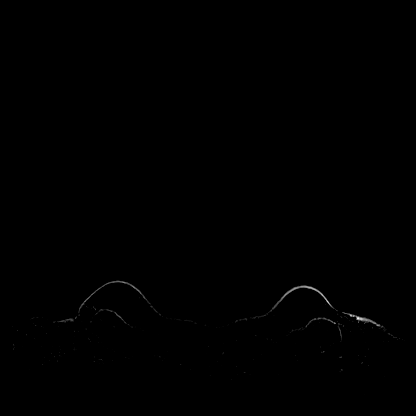

[Series 7: fl3d post-cm 20 · axial · 158.4mm · 0.87mm/px · 1 of 1 slices shown (3 of 3)]
[im 1/1]
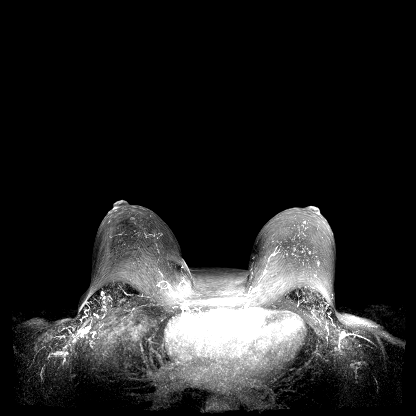

[Series 8: fl3d post-cm 3min · axial · 0.9mm · 0.87mm/px · z∈[-74,+52]mm · 5 of 176 slices shown]
[im 1/176]
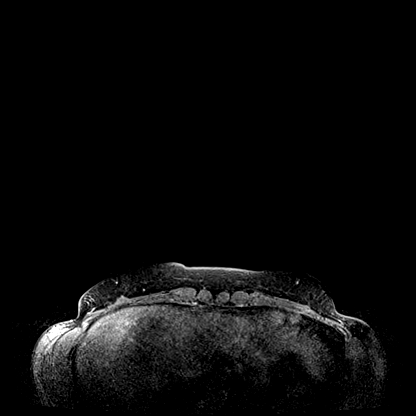
[im 36/176]
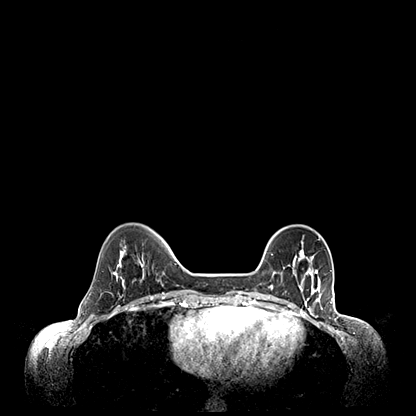
[im 71/176]
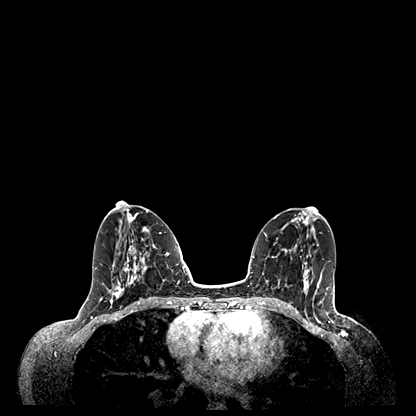
[im 106/176]
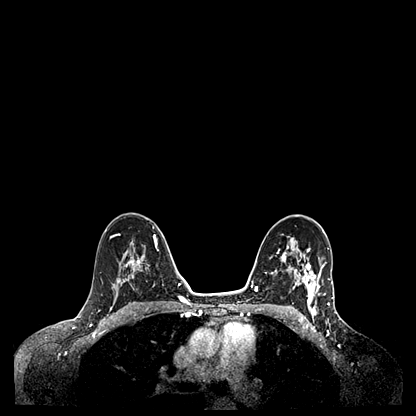
[im 141/176]
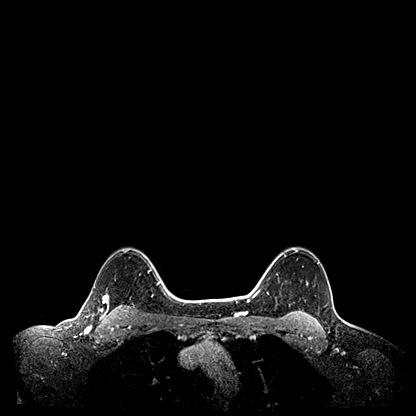

[29 of 48 positions shown; findings below may reference images not displayed]

Mammography
05/24/2018, 05/17/2018, 02/12/2017 and earlier. BILATERAL breast
ultrasound 05/24/2018.
FINDINGS: Breast composition: c. Heterogeneous fibroglandular tissue.

Background parenchymal enhancement: Moderate.

Right breast: No mass or abnormal enhancement. Scattered benign T2
hyperintense cysts.

Left breast: No mass or abnormal enhancement. Scattered benign T2
hyperintense cysts.

Lymph nodes: No pathologic lymphadenopathy.

Ancillary findings:  None.
IMPRESSION: No MRI evidence of malignancy involving either breast.

RECOMMENDATION:
1. Annual BILATERAL screening mammography which is due in May 2019.
2. Annual or biennial supplemental screening MRI can also be
considered in this intermediate risk patient with family history and
dense breasts mammographically.

BI-RADS CATEGORY  2: Benign.

## 2019-01-20 ENCOUNTER — Telehealth: Payer: Self-pay | Admitting: *Deleted

## 2019-01-20 NOTE — Telephone Encounter (Signed)
REFERRAL SENT TO SCHEDULING AND NOTES ON FILE FROM [facility] [place], [place] [phone]
